# Patient Record
Sex: Male | Born: 1980 | Hispanic: Yes | Marital: Married | Smoking: Never smoker
Health system: Southern US, Community
[De-identification: ages and names within clinical notes are randomized; demographics above are authoritative.]

---

## 2016-01-29 ENCOUNTER — Encounter (HOSPITAL_COMMUNITY): Payer: Self-pay

## 2016-01-29 ENCOUNTER — Emergency Department (HOSPITAL_COMMUNITY): Payer: Self-pay

## 2016-01-29 ENCOUNTER — Emergency Department (HOSPITAL_COMMUNITY)
Admission: EM | Admit: 2016-01-29 | Discharge: 2016-01-29 | Disposition: A | Discharge status: Home / Self Care | Payer: Self-pay | Attending: Emergency Medicine | Admitting: Emergency Medicine

## 2016-01-29 DIAGNOSIS — M79606 Pain in leg, unspecified: Secondary | ICD-10-CM

## 2016-01-29 DIAGNOSIS — D169 Benign neoplasm of bone and articular cartilage, unspecified: Secondary | ICD-10-CM | POA: Insufficient documentation

## 2016-01-29 DIAGNOSIS — M8480 Other disorders of continuity of bone, unspecified site: Secondary | ICD-10-CM

## 2016-01-29 DIAGNOSIS — M5441 Lumbago with sciatica, right side: Secondary | ICD-10-CM | POA: Insufficient documentation

## 2016-01-29 MED ORDER — DICLOFENAC SODIUM 50 MG PO TBEC: 50.0000 mg | DELAYED_RELEASE_TABLET | Freq: Two times a day (BID) | ORAL | 0 refills | Status: AC

## 2016-01-29 MED ORDER — HYDROCODONE-ACETAMINOPHEN 5-325 MG PO TABS: 1.0000 | ORAL_TABLET | Freq: Four times a day (QID) | ORAL | 0 refills | Status: AC | PRN

## 2016-01-29 NOTE — ED Notes (Signed)
MRI contacted again and notified for pending results.

## 2016-01-29 NOTE — ED Provider Notes (Signed)
THIS IS A SHARED VISIT WITH Larry James, PAC.   Larry James is a 35 y.o. male here for back pain that radiates to the right leg. Due to abnormal findings on a recent CT that recommended an MRI if symptoms indicated the patient is sent for MRI today.   BP 137/90 (BP Location: Right Arm)   Pulse 71   Temp 97.8 F (36.6 C) (Oral)   Resp 17   SpO2 100%   5:25pm patient is in MRI at this time.  [name] Right Wo Contrast  Result Date: 01/29/2016 CLINICAL DATA:  35 year old male complaining of worsening moderate back pain radiating to right leg x1 week. Pain worse with movement. Nine pain with waking. A 3.9 cm intra medullary proximal right femoral lesion seen on outside CT performed at [facility]: MRI OF THE RIGHT FEMUR WITHOUT CONTRAST TECHNIQUE: Multiplanar, multisequence MR imaging of the right femur was performed. No intravenous contrast was administered. COMPARISON:  None. FINDINGS: Bones/Joint/Cartilage There is a a subtrochanteric predominantly intramedullary though slightly eccentric non expansile T1 intermediate to hypointense lesion of the right femur measuring 4 x 2.2 x 2.6 cm. It exhibits no soft tissue component and demonstrates a narrow zone of transition. No periosteal new bone formation is identified. On T2 fat saturated images there is a small ovoid focus of T2 hyperintensity along its upper margin measuring approximately 1 x 1.4 x 0.9 cm. There is a thin sagittally oriented septation in the upper aspect as well. Ligaments Negative Muscles and Tendons Unremarkable Soft tissues No soft tissue mass or hemorrhage associated with this lesion. IMPRESSION: Non expansile well-circumscribed predominant intramedullary but slightly eccentric lesion of the proximal right femur involving the sub trochanteric portion measuring 4 x 2.2 x 2.6 cm. Differential possibilities might include fibrous dysplasia or potentially a large nonossifying fibroma although the patient is slightly older for  this. No periosteal new bone formation, cortical fracture nor soft tissue component to suggest an aggressive neoplastic lesion or metastasis. Low-grade infection/osteomyelitis believed less likely. Correlation with plain radiographs and with outside CT would be helpful for further characterization. Electronically Signed   By: Larry James M.D.   On: 01/29/2016 20:53  Results of MRI reviewed with Dr. Leonette James.  Discussed results of MRI with the patient and his family and need for f/u with ortho. They voice understanding and agree with plan.  Rx for Voltaren and hydrocodone given.  Patient stable for d/c.     [place], [place] 01/29/16 2107    Larry Blank, MD 01/30/16 [phone]

## 2016-01-29 NOTE — ED Notes (Signed)
This RN contacted MRI as to the delay for the results for MRI

## 2016-01-29 NOTE — ED Provider Notes (Signed)
[facility] DEPT Provider Note   CSN: [id number] Arrival date & time: 01/29/16  1432  By signing my name below, I, Avnee Patel, attest that this documentation has been prepared under the direction and in the presence of  [employer]. Electronically Signed: Delton [place], ED Scribe. 01/29/16. 3:13 PM.   History   Chief Complaint Chief Complaint  Patient presents with  . Back Pain   The history is provided by the patient. No language interpreter was used.   HPI Comments:  Conal Treharne is a 35 y.o. male who presents to the Emergency Department complaining of worsening, moderate back pain which radiates to his right leg x 1 week. His pain is worse with movement. Wife states pt was seen at a hospital in [place], had a CT scan of his abdomen with abnormal findings and was given antibiotics (Cipro) which have provided no relief. Pt denies any other associated symptoms and modifying factors at this time. Patient denies warning symptoms of back pain including: fecal incontinence, urinary retention or overflow incontinence, night sweats, unexplained fevers or weight loss, h/o cancer, IVDU, recent trauma.  Patient has had night pain and waking 2/2 pain.   CT-abdomen/pelvis impression from [facility]: 3.9 cm intramedullary proximal right femur lesion.     History reviewed. No pertinent past medical history.  There are no active problems to display for this patient.   History reviewed. No pertinent surgical history.   Home Medications    Prior to Admission medications   Not on File    Family History No family history on file.  Social History Social History  Substance Use Topics  . Smoking status: Never Smoker  . Smokeless tobacco: Never Used  . Alcohol use Not on file     Allergies   Patient has no known allergies.   Review of Systems Review of Systems  Constitutional: Negative for fever and unexpected weight change.  Gastrointestinal: Negative for  constipation.       Neg for fecal incontinence  Genitourinary: Negative for difficulty urinating, flank pain and hematuria.       Negative for urinary incontinence or retention  Musculoskeletal: Positive for back pain and myalgias.  Neurological: Negative for weakness and numbness.       Negative for saddle paresthesias   Psychiatric/Behavioral: Positive for sleep disturbance.     Physical Exam Updated Vital Signs BP 137/90 (BP Location: Right Arm)   Pulse 71   Temp 97.8 F (36.6 C) (Oral)   Resp 17   SpO2 100%   Physical Exam  Constitutional: He appears well-developed and well-nourished. No distress.  HENT:  Head: Normocephalic and atraumatic.  Eyes: Conjunctivae are normal.  Neck: Normal range of motion.  Cardiovascular: Normal rate.   Pulmonary/Chest: Effort normal.  Abdominal: Soft. He exhibits no distension. There is no tenderness. There is no CVA tenderness.  Musculoskeletal: Normal range of motion.       Right hip: He exhibits tenderness (R groin) and bony tenderness. He exhibits normal range of motion and normal strength.       Right knee: Normal.       Right ankle: Normal.       Cervical back: He exhibits normal range of motion, no tenderness and no bony tenderness.       Thoracic back: He exhibits normal range of motion, no tenderness and no bony tenderness.       Lumbar back: He exhibits tenderness. He exhibits no bony tenderness.  Back:       Right upper leg: Normal. He exhibits no tenderness.  No step-off noted with palpation of spine.   Neurological: He is alert. He has normal reflexes. No sensory deficit. He exhibits normal muscle tone.  5/5 strength in entire lower extremities bilaterally. No sensation deficit.   Skin: Skin is warm and dry.  Psychiatric: He has a normal mood and affect.  Nursing note and vitals reviewed.    ED Treatments / Results  DIAGNOSTIC STUDIES:  Oxygen Saturation is 100% on RA, normal by my interpretation.     COORDINATION OF CARE:  3:11 PM Discussed treatment plan with pt at bedside and pt agreed to plan. Records obtained from [facility]. Findings as above. Feel given that patient is symptomatic likely from this lesion, will follow-up with MRI in emergency department today.  D/w Dr. Leonette Monarch. Handoff to [facility] NP at shift change. She will follow-up on results and dispo with appropriate specialist follow-up.   5:10 PM Pt in MRI.   Procedures Procedures (including critical care time)   Initial Impression / Assessment and Plan / ED Course  I have reviewed the triage vital signs and the nursing notes.  Pertinent labs & imaging results that were available during my care of the patient were reviewed by me and considered in my medical decision making (see chart for details).  Clinical Course    Pending MRI results.   Final Clinical Impressions(s) / ED Diagnoses   Final diagnoses:  Leg pain  Acute right-sided low back pain with right-sided sciatica    New Prescriptions New Prescriptions   No medications on file  I personally performed the services described in this documentation, which was scribed in my presence. The recorded information has been reviewed and is accurate.     Carlisle Cater, PA-C 01/29/16 [place], MD 01/30/16 [phone]

## 2016-01-29 NOTE — ED Notes (Signed)
Radiology room contacted directly per this RN. RN informed that radiologist MD is reading it right now.

## 2016-01-29 NOTE — Discharge Instructions (Signed)
You will need to Call Dr. Randel Pigg office in the morning to schedule a follow up appointment. It would be helpful if you could get a disc of the CT scan that was done so he can compare it with the MRI that you had done today.  Do not take the narcotic if you are driving as it will make you sleepy.

## 2016-01-29 NOTE — ED Triage Notes (Signed)
Patient complains of lower back pain with radiation down right leg x 1 week, pain worse with movment and change in position

## 2016-01-30 ENCOUNTER — Telehealth (INDEPENDENT_AMBULATORY_CARE_PROVIDER_SITE_OTHER): Payer: Self-pay

## 2016-01-30 NOTE — Telephone Encounter (Signed)
Talked with patient wife and advised her that appointment that was available for tomorrow morning was no longer available. Patient stated that was okay, stated will call back if any change with patient. Patient does have an appointment scheduled for 02/05/16.

## 2016-01-30 NOTE — Telephone Encounter (Signed)
Patient wife Pamala Hurry called stating that patient has an appt. Scheduled for 02/05/16, but wanted to know if patient could be seen sooner. Tried to call Pamala Hurry to offer an appointment for tomorrow morning, but no answer and voicemail is full.  Will try again later. [phone]

## 2016-02-05 ENCOUNTER — Ambulatory Visit (INDEPENDENT_AMBULATORY_CARE_PROVIDER_SITE_OTHER): Payer: Self-pay | Admitting: Orthopedic Surgery

## 2017-11-15 IMAGING — MR MR FEMUR*R* W/O CM
4 of 5 series · 19 of 40 positions shown · non-contrast
Comparison: None.

CLINICAL DATA: 35-year-old male complaining of worsening moderate
back pain radiating to right leg x1 week. Pain worse with movement.
Nine pain with waking. A 3.9 cm intra medullary proximal right
femoral lesion seen on outside CT performed at [REDACTED].

EXAM:
MRI OF THE RIGHT FEMUR WITHOUT CONTRAST
TECHNIQUE: Multiplanar, multisequence MR imaging of the right femur was
performed. No intravenous contrast was administered.

[Series 6: T1 · axial · 5.0mm · 0.78mm/px · z∈[-217,+106]mm · 3 of 57 slices shown (1 of 2)]
[im 7/57]
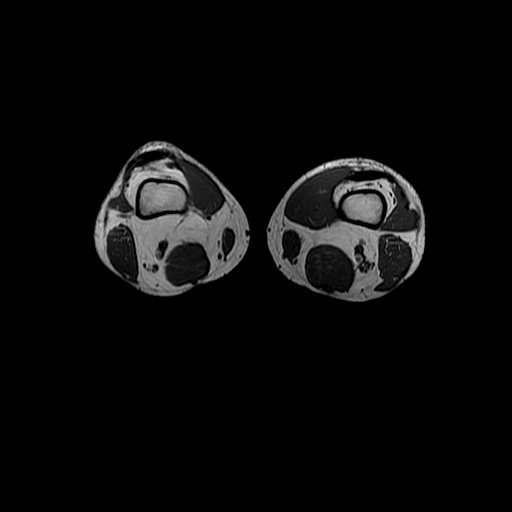
[im 32/57]
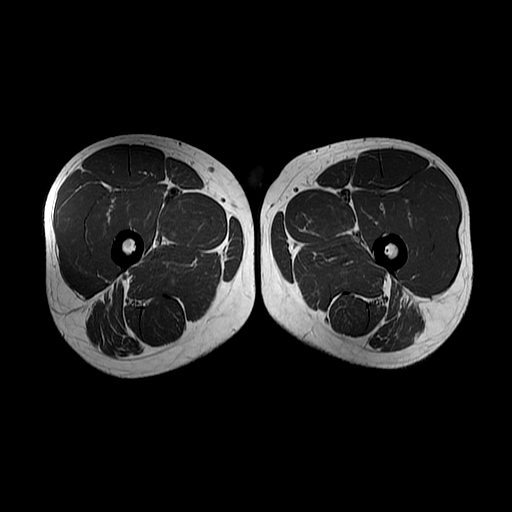
[im 50/57]
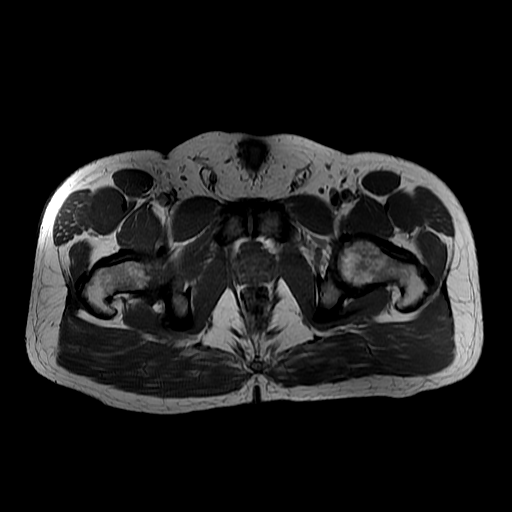

[Series 7: T2 fat-sat · axial · 5.0mm · 0.78mm/px · z∈[-253,+106]mm · 10 of 68 slices shown]
[im 1/68]
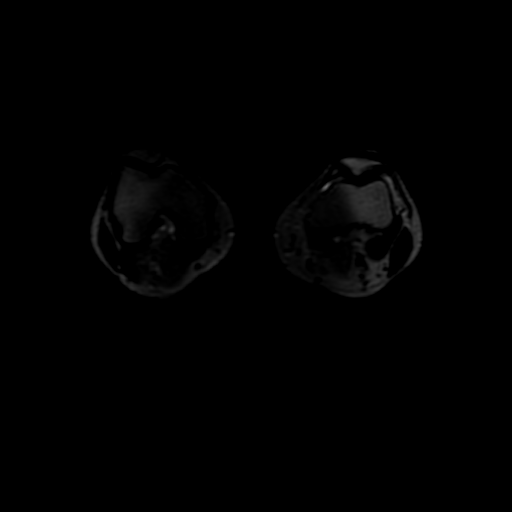
[im 7/68]
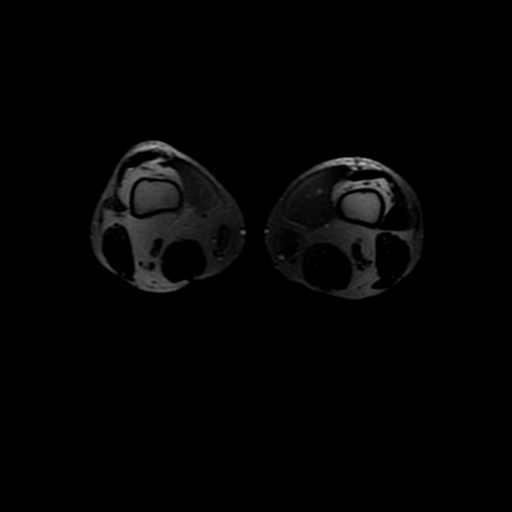
[im 14/68]
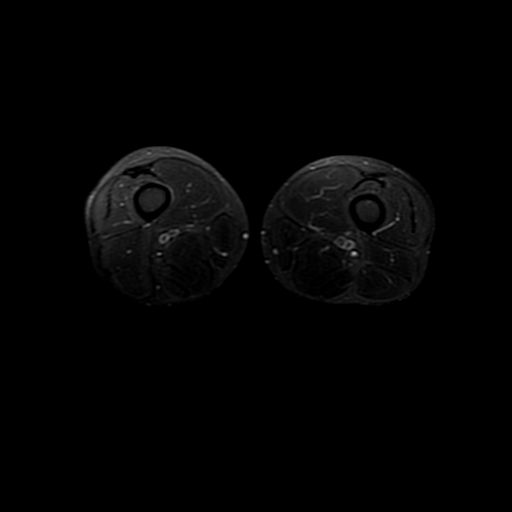
[im 21/68]
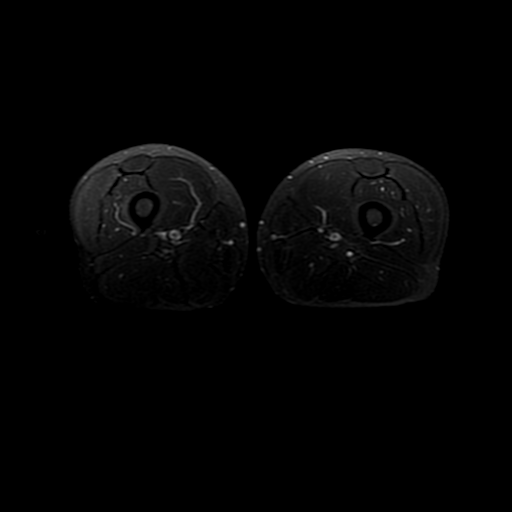
[im 27/68]
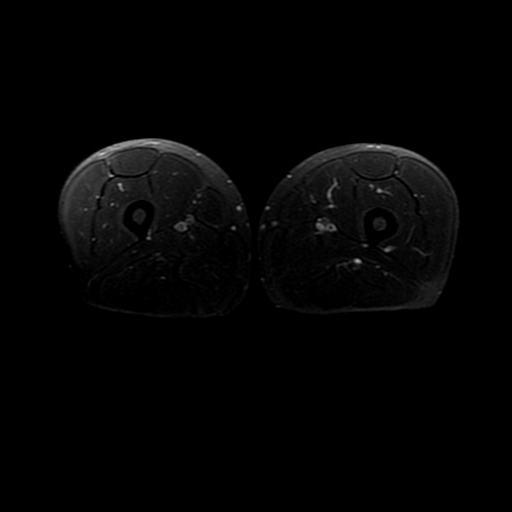
[im 34/68]
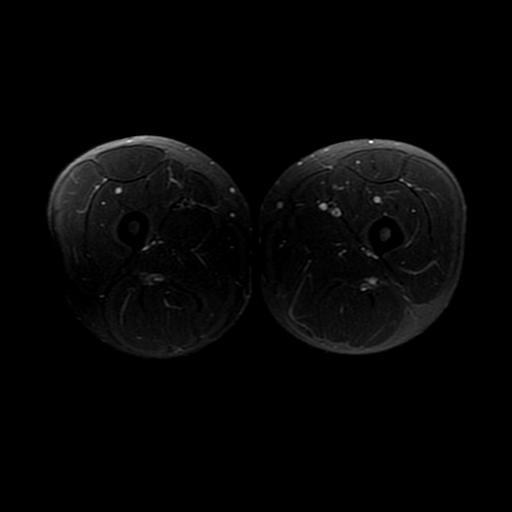
[im 41/68]
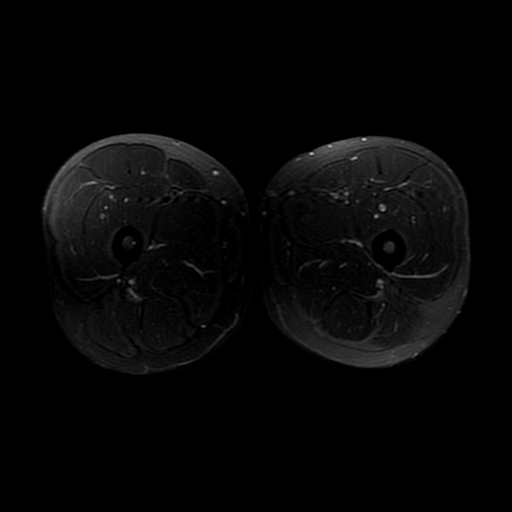
[im 47/68]
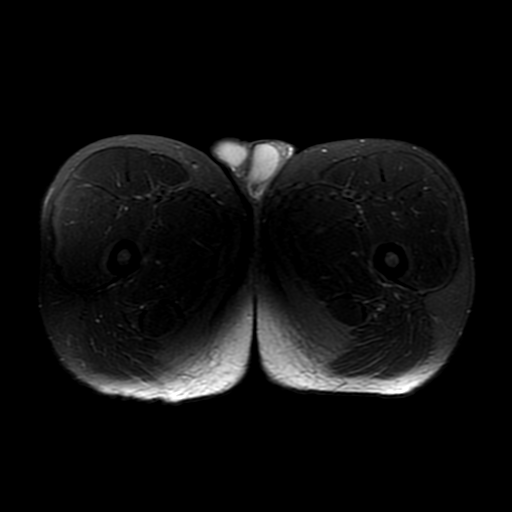
[im 54/68]
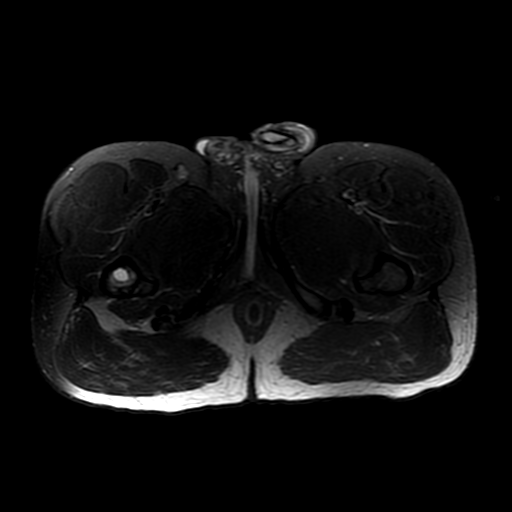
[im 61/68]
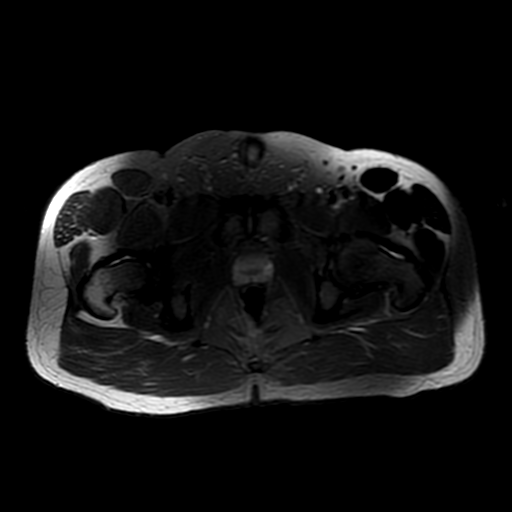

[Series 8: T1 · sagittal · 5.0mm · 0.78mm/px · 3 of 38 slices shown (2 of 2)]
[im 8/38]
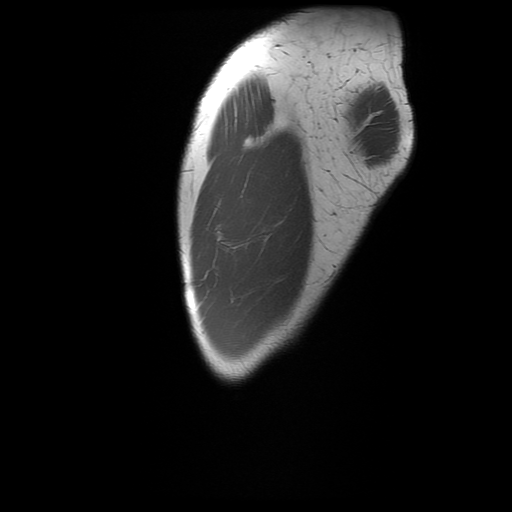
[im 23/38]
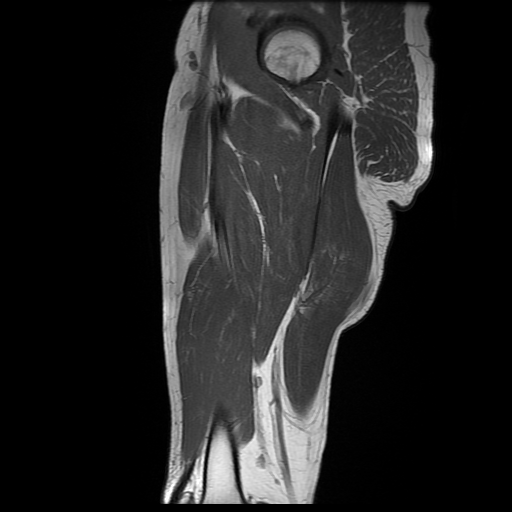
[im 38/38]
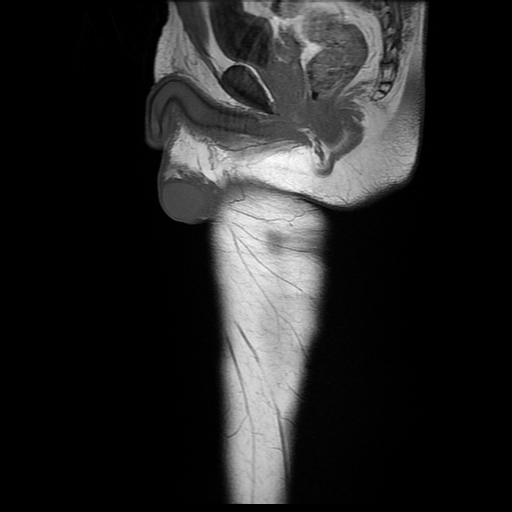

[Series 9: sag fse ir · sagittal · 5.0mm · 0.78mm/px · 3 of 38 slices shown]
[im 8/38]
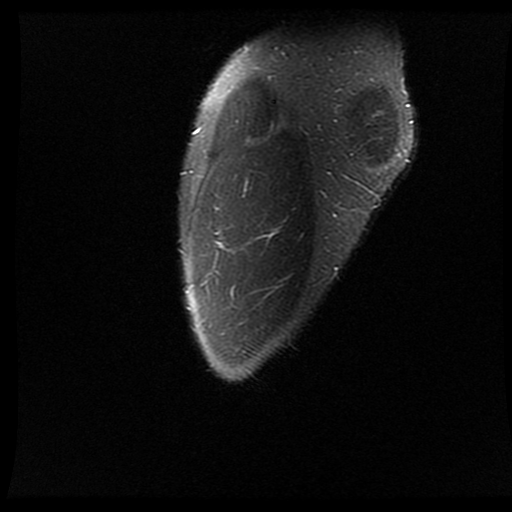
[im 23/38]
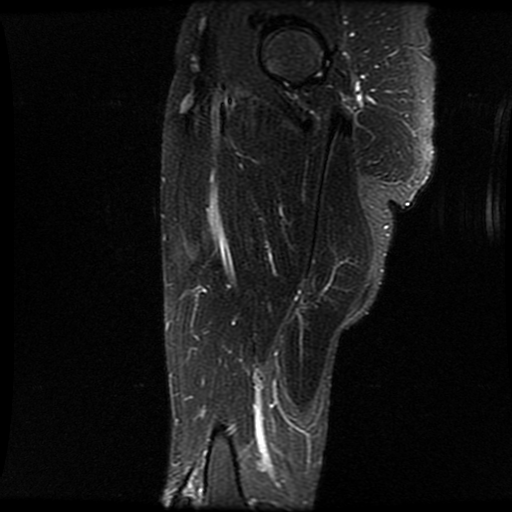
[im 38/38]
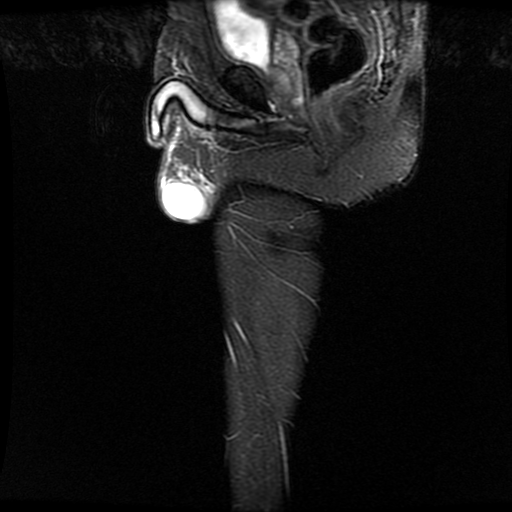

[19 of 40 positions shown; findings below may reference images not displayed]

FINDINGS: Bones/Joint/Cartilage

There is a a subtrochanteric predominantly intramedullary though
slightly eccentric non expansile T1 intermediate to hypointense
lesion of the right femur measuring 4 x 2.2 x 2.6 cm. It exhibits no
soft tissue component and demonstrates a narrow zone of transition.
No periosteal new bone formation is identified. On T2 fat saturated
images there is a small ovoid focus of T2 hyperintensity along its
upper margin measuring approximately 1 x 1.4 x 0.9 cm. There is a
thin sagittally oriented septation in the upper aspect as well.

Ligaments

Negative

Muscles and Tendons

Unremarkable

Soft tissues

No soft tissue mass or hemorrhage associated with this lesion.
IMPRESSION: Non expansile well-circumscribed predominant intramedullary but
slightly eccentric lesion of the proximal right femur involving the
sub trochanteric portion measuring 4 x 2.2 x 2.6 cm. Differential
possibilities might include fibrous dysplasia or potentially a large
nonossifying fibroma although the patient is slightly older for
this. No periosteal new bone formation, cortical fracture nor soft
tissue component to suggest an aggressive neoplastic lesion or
metastasis. Low-grade infection/osteomyelitis believed less likely.

Correlation with plain radiographs and with outside CT would be
helpful for further characterization.
